# Patient Record
Sex: Female | Born: 1980 | Race: White | Hispanic: No | Marital: Single | Smoking: Never smoker
Health system: Southern US, Community
[De-identification: ages and names within clinical notes are randomized; demographics above are authoritative.]

## PROBLEM LIST (undated history)

## (undated) DIAGNOSIS — Z9889 Other specified postprocedural states: Secondary | ICD-10-CM

## (undated) DIAGNOSIS — F410 Panic disorder [episodic paroxysmal anxiety] without agoraphobia: Secondary | ICD-10-CM

## (undated) DIAGNOSIS — I359 Nonrheumatic aortic valve disorder, unspecified: Secondary | ICD-10-CM

## (undated) DIAGNOSIS — R112 Nausea with vomiting, unspecified: Secondary | ICD-10-CM

## (undated) DIAGNOSIS — Z8639 Personal history of other endocrine, nutritional and metabolic disease: Secondary | ICD-10-CM

## (undated) HISTORY — PX: OTHER SURGICAL HISTORY: SHX169

## (undated) HISTORY — PX: WISDOM TOOTH EXTRACTION: SHX21

---
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
## 2016-12-14 ENCOUNTER — Emergency Department
Admission: EM | Admit: 2016-12-14 | Discharge: 2016-12-15 | Disposition: A | Discharge status: Home / Self Care | Payer: BLUE CROSS/BLUE SHIELD | Attending: Emergency Medicine | Admitting: Emergency Medicine

## 2016-12-14 ENCOUNTER — Encounter: Payer: Self-pay | Admitting: Emergency Medicine

## 2016-12-14 ENCOUNTER — Emergency Department: Payer: BLUE CROSS/BLUE SHIELD

## 2016-12-14 DIAGNOSIS — Z5181 Encounter for therapeutic drug level monitoring: Secondary | ICD-10-CM | POA: Insufficient documentation

## 2016-12-14 DIAGNOSIS — R42 Dizziness and giddiness: Secondary | ICD-10-CM | POA: Diagnosis present

## 2016-12-14 DIAGNOSIS — R200 Anesthesia of skin: Secondary | ICD-10-CM

## 2016-12-14 DIAGNOSIS — M5010 Cervical disc disorder with radiculopathy, unspecified cervical region: Secondary | ICD-10-CM | POA: Insufficient documentation

## 2016-12-14 DIAGNOSIS — R29898 Other symptoms and signs involving the musculoskeletal system: Secondary | ICD-10-CM

## 2016-12-14 DIAGNOSIS — G959 Disease of spinal cord, unspecified: Secondary | ICD-10-CM

## 2016-12-14 DIAGNOSIS — M5020 Other cervical disc displacement, unspecified cervical region: Secondary | ICD-10-CM

## 2016-12-14 LAB — DIFFERENTIAL
Basophils Absolute: 0 10*3/uL (ref 0–0.1)
Basophils Relative: 1 %
EOS PCT: 7 %
Eosinophils Absolute: 0.7 10*3/uL (ref 0–0.7)
LYMPHS ABS: 3 10*3/uL (ref 1.0–3.6)
LYMPHS PCT: 31 %
MONO ABS: 0.7 10*3/uL (ref 0.2–0.9)
MONOS PCT: 7 %
NEUTROS ABS: 5.3 10*3/uL (ref 1.4–6.5)
Neutrophils Relative %: 54 %

## 2016-12-14 LAB — COMPREHENSIVE METABOLIC PANEL
ALK PHOS: 47 U/L (ref 38–126)
ALT: 22 U/L (ref 14–54)
AST: 30 U/L (ref 15–41)
Albumin: 4.1 g/dL (ref 3.5–5.0)
Anion gap: 8 (ref 5–15)
BILIRUBIN TOTAL: 0.8 mg/dL (ref 0.3–1.2)
BUN: 13 mg/dL (ref 6–20)
CALCIUM: 9.3 mg/dL (ref 8.9–10.3)
CO2: 27 mmol/L (ref 22–32)
CREATININE: 0.97 mg/dL (ref 0.44–1.00)
Chloride: 103 mmol/L (ref 101–111)
GFR calc non Af Amer: >60 mL/min (ref >60)
Glucose, Bld: 126 mg/dL — ABNORMAL HIGH (ref 65–99)
Potassium: 3.6 mmol/L (ref 3.5–5.1)
SODIUM: 138 mmol/L (ref 135–145)
TOTAL PROTEIN: 7.5 g/dL (ref 6.5–8.1)

## 2016-12-14 LAB — CBC
HEMATOCRIT: 40.9 % (ref 35.0–47.0)
HEMOGLOBIN: 14.5 g/dL (ref 12.0–16.0)
MCH: 30.8 pg (ref 26.0–34.0)
MCHC: 35.4 g/dL (ref 32.0–36.0)
MCV: 86.9 fL (ref 80.0–100.0)
PLATELETS: 309 10*3/uL (ref 150–440)
RBC: 4.71 MIL/uL (ref 3.80–5.20)
RDW: 13.2 % (ref 11.5–14.5)
WBC: 9.7 10*3/uL (ref 3.6–11.0)

## 2016-12-14 LAB — PROTIME-INR
INR: 1.04
Prothrombin Time: 13.6 seconds (ref 11.4–15.2)

## 2016-12-14 LAB — TROPONIN I: Troponin I: <0.03 ng/mL (ref <0.03)

## 2016-12-14 LAB — APTT: aPTT: 27 seconds (ref 24–36)

## 2016-12-14 NOTE — ED Triage Notes (Addendum)
Pt to [facility], reports dizziness starting at 1300 and left sided numbness starting about 1.5 hours ago, numbness to left tongue, mouth, face, arm.  Pt NAD at this time.  Smile symmetrical, pt reports some difficulty with speech, equal hand grip

## 2016-12-14 NOTE — ED Notes (Signed)
Per Dr. Roxan Hockeyobinson, do not call code stroke at this time
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
## 2016-12-14 NOTE — ED Notes (Signed)
Call placed to MRI tech. Waiting on response at this time.

## 2016-12-15 ENCOUNTER — Emergency Department: Payer: BLUE CROSS/BLUE SHIELD

## 2016-12-15 LAB — POCT PREGNANCY, URINE: PREG TEST UR: NEGATIVE

## 2016-12-15 MED ORDER — PREDNISONE 20 MG PO TABS
60.0000 mg | ORAL_TABLET | Freq: Once | ORAL | Status: AC
  Administered 2016-12-15: 60 mg via ORAL
  Filled 2016-12-15: qty 3

## 2016-12-15 MED ORDER — METHYLPREDNISOLONE 4 MG PO TBPK: ORAL_TABLET | ORAL | 0 refills | Status: DC

## 2016-12-15 MED ORDER — GADOBENATE DIMEGLUMINE 529 MG/ML IV SOLN
15.0000 mL | Freq: Once | INTRAVENOUS | Status: AC | PRN
  Administered 2016-12-15: 15 mL via INTRAVENOUS

## 2016-12-15 NOTE — ED Notes (Signed)
MRI tech Judeth CornfieldStephanie talking to pt via phone at this time.

## 2016-12-15 NOTE — ED Notes (Signed)
Negative urine pregnancy test.

## 2016-12-15 NOTE — ED Provider Notes (Signed)
-----------------------------------------   3:08 AM on 12/15/2016 -----------------------------------------   Blood pressure 122/69, pulse 63, temperature 97.6 F (36.4 C), resp. rate 16, height 5\' 7"  (1.702 m), weight 160 lb (72.6 kg), last menstrual period 11/14/2016, SpO2 98 %.  Assuming care from Dr. Derrill KayGoodman.  In short, Madeline Beasley is a 35 y.o. female with a chief complaint of Numbness and Dizziness .  Refer to the original H&P for additional details.  The current plan of care is to follow up the results of the MRI.  MRI cervical spine 1. 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  2. No evidence of demyelinating disease.  3. 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    MRI head Normal MRI of brain. No acute abnormality or evidence of demyelinating disease.  I contacted the neurosurgeon at [facility]. He did look at the MRI that the patient received and he agreed that the patient had a significant disc bulge. The patient reports though that her symptoms are resolved and she no longer has any weakness. The neurosurgeon recommended a CT scan to ensure that this was not a calcified bulging. He also recommended giving the patient a Medrol Dosepak and having her follow-up in the office. He did say that should the patient wanted to be transferred [facility] for more reassurance that he was okay with that as well. I discussed this with the patient and her parents and they did not feel strongly to be transferred to [facility]. I will give the patient a dose of prednisone here and I will write her a Medrol Dosepak. The patient has no other symptoms and feels improved at this time. Although she is from [place] she reports that she is willing to come back to [place] to [place].  I discussed this care with Dr. Emogene [facility]Abd-el[facility]bar[facility]  CT cervical spine 1. Disc osteophyte complex at C5-6  with moderate to severe spinal  canal stenosis and compression of the ventral aspect of spinal cord.  The soft disc component is most prominent centrally. The greatest  bony component is within the left subarticular space.  2. **An incidental finding of potential clinical significance has  been found. 1.6 cm hypodense right thyroid nodule. Further  characterization with dedicated thyroid ultrasound is recommended.**   The patient will be discharged to home.   Rebecka ApleyAllison P Webster, MD 12/15/16 [phone]

## 2016-12-15 NOTE — ED Notes (Signed)
Pt is in good condition; discharge instructions reviewed, follow up care and home care reviewed, prescription medication reviewed; pt verbalized understanding; pt is ambulatory and went home with parents

## 2016-12-15 NOTE — ED Notes (Signed)
Pt to MRI with EDT Juanetta.

## 2016-12-15 NOTE — Discharge Instructions (Signed)
Please follow up with the neurosurgeon and he reports he will call you for an appointment. Return with any worsening symptoms.

## 2017-01-06 ENCOUNTER — Encounter
Admission: RE | Admit: 2017-01-06 | Discharge: 2017-01-06 | Disposition: A | Discharge status: Home / Self Care | Payer: BLUE CROSS/BLUE SHIELD | Source: Ambulatory Visit | Attending: Neurological Surgery | Admitting: Neurological Surgery

## 2017-01-06 ENCOUNTER — Ambulatory Visit
Admission: RE | Admit: 2017-01-06 | Discharge: 2017-01-06 | Disposition: A | Discharge status: Home / Self Care | Payer: BLUE CROSS/BLUE SHIELD | Source: Ambulatory Visit | Attending: Neurological Surgery | Admitting: Neurological Surgery

## 2017-01-06 DIAGNOSIS — Z01810 Encounter for preprocedural cardiovascular examination: Secondary | ICD-10-CM | POA: Diagnosis not present

## 2017-01-06 DIAGNOSIS — I359 Nonrheumatic aortic valve disorder, unspecified: Secondary | ICD-10-CM | POA: Diagnosis not present

## 2017-01-06 DIAGNOSIS — Z01812 Encounter for preprocedural laboratory examination: Secondary | ICD-10-CM | POA: Insufficient documentation

## 2017-01-06 DIAGNOSIS — Z01818 Encounter for other preprocedural examination: Secondary | ICD-10-CM | POA: Insufficient documentation

## 2017-01-06 HISTORY — DX: Nausea with vomiting, unspecified: Z98.890

## 2017-01-06 HISTORY — DX: Personal history of other endocrine, nutritional and metabolic disease: Z86.39

## 2017-01-06 HISTORY — DX: Nausea with vomiting, unspecified: R11.2

## 2017-01-06 HISTORY — DX: Nonrheumatic aortic valve disorder, unspecified: I35.9

## 2017-01-06 HISTORY — DX: Panic disorder (episodic paroxysmal anxiety): F41.0

## 2017-01-06 LAB — TYPE AND SCREEN
ABO/RH(D): O NEG
Antibody Screen: NEGATIVE

## 2017-01-06 LAB — BASIC METABOLIC PANEL
Anion gap: 6 (ref 5–15)
BUN: 10 mg/dL (ref 6–20)
CALCIUM: 9 mg/dL (ref 8.9–10.3)
CO2: 26 mmol/L (ref 22–32)
CREATININE: 0.85 mg/dL (ref 0.44–1.00)
Chloride: 103 mmol/L (ref 101–111)
GFR calc Af Amer: >60 mL/min (ref >60)
GFR calc non Af Amer: >60 mL/min (ref >60)
GLUCOSE: 87 mg/dL (ref 65–99)
Potassium: 3.8 mmol/L (ref 3.5–5.1)
Sodium: 135 mmol/L (ref 135–145)

## 2017-01-06 LAB — CBC
HEMATOCRIT: 39.6 % (ref 35.0–47.0)
Hemoglobin: 14 g/dL (ref 12.0–16.0)
MCH: 30.8 pg (ref 26.0–34.0)
MCHC: 35.3 g/dL (ref 32.0–36.0)
MCV: 87.3 fL (ref 80.0–100.0)
Platelets: 255 10*3/uL (ref 150–440)
RBC: 4.54 MIL/uL (ref 3.80–5.20)
RDW: 13.3 % (ref 11.5–14.5)
WBC: 7.1 10*3/uL (ref 3.6–11.0)

## 2017-01-06 LAB — URINALYSIS, COMPLETE (UACMP) WITH MICROSCOPIC
BILIRUBIN URINE: NEGATIVE
Glucose, UA: NEGATIVE mg/dL
HGB URINE DIPSTICK: NEGATIVE
Ketones, ur: NEGATIVE mg/dL
Leukocytes, UA: NEGATIVE
NITRITE: NEGATIVE
PH: 7 (ref 5.0–8.0)
Protein, ur: NEGATIVE mg/dL
RBC / HPF: NONE SEEN RBC/hpf (ref 0–5)
SPECIFIC GRAVITY, URINE: 1.015 (ref 1.005–1.030)

## 2017-01-06 LAB — PROTIME-INR
INR: 1.12
Prothrombin Time: 14.5 seconds (ref 11.4–15.2)

## 2017-01-06 LAB — APTT: aPTT: 28 seconds (ref 24–36)

## 2017-01-06 LAB — SURGICAL PCR SCREEN
MRSA, PCR: NEGATIVE
STAPHYLOCOCCUS AUREUS: NEGATIVE

## 2017-01-06 NOTE — Patient Instructions (Signed)
  Your procedure is scheduled on: January 08, 2017 (Wednesday) Report to Same Day Surgery 2nd floor medical mall [facility](Medical Mall Entrance-take elevator on left to 2nd floor.  Check in with surgery information desk.) To find out your arrival time please call [phone] between 1PM - 3PM on January 07, 2017 (Tuesday)  Remember: Instructions that are not followed completely may result in serious medical risk, up to and including death, or upon the discretion of your surgeon and anesthesiologist your surgery may need to be rescheduled.    _x___ 1. Do not eat food or drink liquids after midnight. No gum chewing or hard candies.     __x__ 2. No Alcohol for 24 hours before or after surgery.   __x__3. No Smoking for 24 prior to surgery.   ____  4. Bring all medications with you on the day of surgery if instructed.    __x__ 5. Notify your doctor if there is any change in your medical condition     (cold, fever, infections).     Do not wear jewelry, make-up, hairpins, clips or nail polish.  Do not wear lotions, powders, or perfumes. You may wear deodorant.  Do not shave 48 hours prior to surgery. Men may shave face and neck.  Do not bring valuables to the hospital.    [facility] is not responsible for any belongings or valuables.               Contacts, dentures or bridgework may not be worn into surgery.  Leave your suitcase in the car. After surgery it may be brought to your room.  For patients admitted to the hospital, discharge time is determined by your treatment team.   Patients discharged the day of surgery will not be allowed to drive home.  You will need someone to drive you home and stay with you the night of your procedure.    Please read over the following fact sheets that you were given:   [facility] Preparing for Surgery and or MRSA Information   ___ Take these medicines the morning of surgery with A SIP OF WATER:    1.   2.  3.  4.  5.  6.  ____Fleets enema or  Magnesium Citrate as directed.   _x___ Use CHG Soap or sage wipes as directed on instruction sheet   ____ Use inhalers on the day of surgery and bring to hospital day of surgery  ____ Stop metformin 2 days prior to surgery    ____ Take 1/2 of usual insulin dose the night before surgery and none on the morning of           surgery.   __x__ Stop Aspirin, Coumadin, Pllavix ,Eliquis, Effient, or Pradaxa (NO ASPIRIN)  x__ Stop Anti-inflammatories such as Advil, Aleve, Ibuprofen, Motrin, Naproxen,          Naprosyn, Goodies powders or aspirin products. Ok to take Tylenol.   ____ Stop supplements until after surgery.    ____ Bring C-Pap to the hospital.

## 2017-01-08 MED ORDER — LACTATED RINGERS IV SOLN
INTRAVENOUS | Status: DC
  Administered 2017-01-29: 10:00:00 via INTRAVENOUS

## 2017-01-08 MED ORDER — FAMOTIDINE 20 MG PO TABS
20.0000 mg | ORAL_TABLET | Freq: Once | ORAL | Status: AC
  Administered 2017-01-29: 20 mg via ORAL

## 2017-01-08 MED ORDER — GELATIN ABSORBABLE 12-7 MM EX MISC
CUTANEOUS | Status: AC
  Filled 2017-01-08: qty 1

## 2017-01-08 MED ORDER — CEFAZOLIN IN D5W 1 GM/50ML IV SOLN
1.0000 g | Freq: Once | INTRAVENOUS | Status: AC
  Administered 2017-01-29: 2 g via INTRAVENOUS

## 2017-01-08 MED ORDER — FAMOTIDINE 20 MG PO TABS
ORAL_TABLET | ORAL | Status: AC
  Filled 2017-01-08: qty 1

## 2017-01-08 MED ORDER — BACITRACIN 50000 UNITS IM SOLR
INTRAMUSCULAR | Status: AC
  Filled 2017-01-08: qty 1

## 2017-01-08 MED ORDER — THROMBIN 5000 UNITS EX SOLR
CUTANEOUS | Status: AC
  Filled 2017-01-08: qty 5000

## 2017-01-08 MED ORDER — SODIUM CHLORIDE 0.9 % IJ SOLN
INTRAMUSCULAR | Status: AC
  Filled 2017-01-08: qty 10

## 2017-01-08 MED ORDER — SCOPOLAMINE 1 MG/3DAYS TD PT72
1.0000 | MEDICATED_PATCH | TRANSDERMAL | Status: DC
  Administered 2017-01-29: 1.5 mg via TRANSDERMAL

## 2017-01-08 MED ORDER — EPINEPHRINE PF 1 MG/ML IJ SOLN
INTRAMUSCULAR | Status: AC
  Filled 2017-01-08: qty 1

## 2017-01-08 MED ORDER — BUPIVACAINE HCL (PF) 0.5 % IJ SOLN
INTRAMUSCULAR | Status: AC
  Filled 2017-01-08: qty 30

## 2017-01-08 MED ORDER — CEFAZOLIN IN D5W 1 GM/50ML IV SOLN
INTRAVENOUS | Status: AC
  Filled 2017-01-08: qty 50

## 2017-01-08 NOTE — H&P (Signed)
Referring Physician:  Self No address on file  Primary Physician:  Madeline MolderStephanie Peterson, PA  Chief Complaint: Left upper extremity numbness.  History of Present Illness: Madeline FreshwaterJennifer Beasley is a 36 y.o. female who presents with the chief complaint of left upper extremity numbness.  Patient has had intermittent left upper extremity numbness for many months.  On December 24 she had an acute exacerbation of this numbness where she felt her left arm was completely numb.  She also had some neck pain and some dizziness associated with this.  No trauma.  She went to the ER where an MRI showed that she had a large C5-C6 disc herniation and a moderate size C6-C7 disc herniation as well.  She was given a Medrol dose pack which helped a little bit with her symptoms.  She does not have any lower extremity symptoms.  She does not have any right-sided symptoms as well.  She has not tried physical therapy.  No bowel or bladder problems.   The symptoms are causing a significant impact on the patient's life.   Review of Systems:  A 10 point review of systems is negative, except for the pertinent positives and negatives detailed in the HPI.  Past Medical History:     Past Medical History:  Diagnosis Date  . Irregular heartbeat     Past Surgical History:      Past Surgical History:  Procedure Laterality Date  . gum surgery  2017  . jaw surgery  1999    Problem List: There is no problem list on file for this patient.   Allergies:    Allergies as of 12/30/2016  . (No Known Allergies)    Medications: Encounter Medications        Outpatient Encounter Prescriptions as of 12/30/2016  Medication Sig Dispense Refill  . norethindrone-ethinyl estradiol (MICROGESTIN,LOESTRIN,JUNEL 1/20) 1-20 mg-mcg tablet      No facility-administered encounter medications on file as of 12/30/2016.       Social History:        Social History   Substance Use Topics   . Smoking status: Never  Smoker   . Smokeless tobacco: Never Used   . Alcohol use Yes     Comment: socially     Family Medical History:      Family History  Problem Relation Age of Onset  . Thyroid disease Mother   . No Known Problems Father     Physical Examination:    Vitals:   12/30/16 1356  BP: 165/86  Pulse: 73  Weight: 75.7 kg (166 lb 12.8 oz)  Height: 170.2 cm (5\' 7" )  PainSc: 0-No pain  PainLoc: Neck    General:             Patient is well developed, well nourished, calm, collected, and in no apparent distress.  Psychiatric:        Patient is non-anxious.  Head:                 Pupils equal, round, and reactive to light.  ENT:                  Oral mucosa appears well hydrated.  Neck:                 Supple.  Full range of motion.  Respiratory:       Patient is breathing without any difficulty.  Extremities:        No edema.  Vascular:  Palpable pulses.  Skin:                  On exposed skin, there are no abnormal skin lesions.  NEUROLOGICAL:  General: In no acute distress.   Awake, alert, oriented to person, place, and time.  Pupils equal round and reactive to light.  Facial tone is symmetric.  Tongue protrusion is midline.  There is no pronator drift.    Strength: Side Biceps Triceps Deltoid Interossei Grip Wrist Ext. Wrist Flex.  R 5 5 5 5 5 5 5   L 5 5 5 5 5 5 5    Side Iliopsoas Quads Hamstring PF DF EHL  R 5 5 5 5 5 5   L 5 5 5 5 5 5    Reflexes are 2+ and symmetric at the biceps, triceps, brachioradialis, patella and achilles.   Bilateral upper and lower extremity sensation is intact to light touch and pin prick.  Clonus is not present.  Toes are down-going.  Gait is normal.  No difficulty with tandem gait.  Hoffman's is absent.  Imaging: MRI cervical spine 12/15/2016 shows large disc herniation at C5-6, and moderate at C6-7.  CT shows evidence of large osteophytes at C5-6 and some ossification of the posterior longitudinal  ligament.   I have personally reviewed the images and agree with the above interpretation.  Assessment and Plan: [name] is a pleasant 36 y.o. female with left upper extremity radiculopathy.  I have discussed the condition with the patient, including showing the radiographs and discussing treatment options in layman's terms.  The patient may benefit from conservative management.  Thus, I have recommended the following:   1) Physical therapy.  If this does not help with her symptoms, she would be a good candidate for C5-7 ACDF.  I do not think that arthroplasty would be good in her case given the degree of ossification.  I will see the patient back in a few weeks to gauge progress.  Thank you for involving me in the care of this patient. I will keep you apprised of the patient's progress.   This note was partially dictated using voice recognition software, so please excuse any errors that were not corrected.   Noe Gens, MD     Electronically signed by Tomma Rakers Abd-El-Barr, MD at 12/30/2016 11:47 PM

## 2017-01-29 ENCOUNTER — Observation Stay
Admission: RE | Admit: 2017-01-29 | Discharge: 2017-01-30 | Disposition: A | Discharge status: Home / Self Care | Payer: BLUE CROSS/BLUE SHIELD | Source: Ambulatory Visit | Attending: Neurological Surgery | Admitting: Neurological Surgery

## 2017-01-29 ENCOUNTER — Encounter: Payer: Self-pay | Admitting: *Deleted

## 2017-01-29 ENCOUNTER — Ambulatory Visit: Payer: BLUE CROSS/BLUE SHIELD | Admitting: Anesthesiology

## 2017-01-29 ENCOUNTER — Encounter
Admission: RE | Disposition: A | Discharge status: Home / Self Care | Payer: Self-pay | Source: Ambulatory Visit | Attending: Neurological Surgery

## 2017-01-29 ENCOUNTER — Observation Stay: Payer: BLUE CROSS/BLUE SHIELD

## 2017-01-29 ENCOUNTER — Ambulatory Visit: Payer: BLUE CROSS/BLUE SHIELD

## 2017-01-29 DIAGNOSIS — M50222 Other cervical disc displacement at C5-C6 level: Principal | ICD-10-CM | POA: Insufficient documentation

## 2017-01-29 DIAGNOSIS — M2578 Osteophyte, vertebrae: Secondary | ICD-10-CM | POA: Diagnosis not present

## 2017-01-29 DIAGNOSIS — F419 Anxiety disorder, unspecified: Secondary | ICD-10-CM | POA: Diagnosis not present

## 2017-01-29 DIAGNOSIS — M4802 Spinal stenosis, cervical region: Secondary | ICD-10-CM | POA: Diagnosis present

## 2017-01-29 DIAGNOSIS — Z09 Encounter for follow-up examination after completed treatment for conditions other than malignant neoplasm: Secondary | ICD-10-CM

## 2017-01-29 DIAGNOSIS — Z419 Encounter for procedure for purposes other than remedying health state, unspecified: Secondary | ICD-10-CM

## 2017-01-29 HISTORY — PX: ANTERIOR CERVICAL DECOMP/DISCECTOMY FUSION: SHX1161

## 2017-01-29 LAB — ABO/RH: ABO/RH(D): O NEG

## 2017-01-29 LAB — CBC
HEMATOCRIT: 39.6 % (ref 35.0–47.0)
HEMOGLOBIN: 13.6 g/dL (ref 12.0–16.0)
MCH: 30.7 pg (ref 26.0–34.0)
MCHC: 34.4 g/dL (ref 32.0–36.0)
MCV: 89.1 fL (ref 80.0–100.0)
Platelets: 240 10*3/uL (ref 150–440)
RBC: 4.44 MIL/uL (ref 3.80–5.20)
RDW: 13.3 % (ref 11.5–14.5)
WBC: 9.3 10*3/uL (ref 3.6–11.0)

## 2017-01-29 LAB — CREATININE, SERUM
Creatinine, Ser: 1 mg/dL (ref 0.44–1.00)
GFR calc Af Amer: >60 mL/min (ref >60)
GFR calc non Af Amer: >60 mL/min (ref >60)

## 2017-01-29 LAB — POCT PREGNANCY, URINE: Preg Test, Ur: NEGATIVE

## 2017-01-29 SURGERY — ANTERIOR CERVICAL DECOMPRESSION/DISCECTOMY FUSION 2 LEVELS
Anesthesia: General | Wound class: Clean

## 2017-01-29 MED ORDER — SEVOFLURANE IN SOLN
RESPIRATORY_TRACT | Status: AC
  Filled 2017-01-29: qty 250

## 2017-01-29 MED ORDER — GELATIN ABSORBABLE 12-7 MM EX MISC
CUTANEOUS | Status: AC
  Filled 2017-01-29: qty 1

## 2017-01-29 MED ORDER — SUCCINYLCHOLINE CHLORIDE 20 MG/ML IJ SOLN
INTRAMUSCULAR | Status: DC | PRN
  Administered 2017-01-29: 100 mg via INTRAVENOUS

## 2017-01-29 MED ORDER — DEXAMETHASONE SODIUM PHOSPHATE 10 MG/ML IJ SOLN
INTRAMUSCULAR | Status: DC | PRN
  Administered 2017-01-29: 10 mg via INTRAVENOUS

## 2017-01-29 MED ORDER — PROPOFOL 10 MG/ML IV BOLUS
INTRAVENOUS | Status: AC
  Filled 2017-01-29: qty 20

## 2017-01-29 MED ORDER — MIDAZOLAM HCL 2 MG/2ML IJ SOLN
INTRAMUSCULAR | Status: DC | PRN
Start: 2017-01-29 — End: 2017-01-29
  Administered 2017-01-29: 2 mg via INTRAVENOUS

## 2017-01-29 MED ORDER — HYDROMORPHONE HCL 1 MG/ML IJ SOLN
0.5000 mg | INTRAMUSCULAR | Status: DC | PRN
Start: 2017-01-29 — End: 2017-01-30

## 2017-01-29 MED ORDER — ONDANSETRON HCL 4 MG/2ML IJ SOLN
INTRAMUSCULAR | Status: AC
  Filled 2017-01-29: qty 2

## 2017-01-29 MED ORDER — GLYCOPYRROLATE 0.2 MG/ML IJ SOLN
INTRAMUSCULAR | Status: DC | PRN
  Administered 2017-01-29: 0.2 mg via INTRAVENOUS

## 2017-01-29 MED ORDER — SUCCINYLCHOLINE CHLORIDE 200 MG/10ML IV SOSY
PREFILLED_SYRINGE | INTRAVENOUS | Status: AC
  Filled 2017-01-29: qty 10

## 2017-01-29 MED ORDER — PHENYLEPHRINE HCL 10 MG/ML IJ SOLN
INTRAMUSCULAR | Status: DC | PRN
  Administered 2017-01-29: 50 ug via INTRAVENOUS

## 2017-01-29 MED ORDER — LACTATED RINGERS IV SOLN: INTRAVENOUS | Status: DC | PRN

## 2017-01-29 MED ORDER — ONDANSETRON HCL 4 MG/2ML IJ SOLN
INTRAMUSCULAR | Status: DC | PRN
  Administered 2017-01-29: 4 mg via INTRAVENOUS

## 2017-01-29 MED ORDER — MENTHOL 3 MG MT LOZG
1.0000 | LOZENGE | OROMUCOSAL | Status: DC | PRN
  Filled 2017-01-29: qty 9

## 2017-01-29 MED ORDER — THROMBIN 5000 UNITS EX SOLR
CUTANEOUS | Status: AC
  Filled 2017-01-29: qty 5000

## 2017-01-29 MED ORDER — CEFAZOLIN SODIUM-DEXTROSE 2-3 GM-% IV SOLR
2.0000 g | Freq: Three times a day (TID) | INTRAVENOUS | Status: AC
Start: 2017-01-29 — End: 2017-01-30
  Administered 2017-01-29 – 2017-01-30 (×2): 2 g via INTRAVENOUS
  Filled 2017-01-29 (×2): qty 50

## 2017-01-29 MED ORDER — BUPIVACAINE-EPINEPHRINE 0.5% -1:200000 IJ SOLN
INTRAMUSCULAR | Status: DC | PRN
  Administered 2017-01-29: 10 mL

## 2017-01-29 MED ORDER — OXYCODONE-ACETAMINOPHEN 5-325 MG PO TABS: 1.0000 | ORAL_TABLET | ORAL | Status: DC | PRN

## 2017-01-29 MED ORDER — THROMBIN 5000 UNITS EX SOLR
CUTANEOUS | Status: DC | PRN
  Administered 2017-01-29: 5000 [IU] via TOPICAL

## 2017-01-29 MED ORDER — SENNOSIDES-DOCUSATE SODIUM 8.6-50 MG PO TABS: 1.0000 | ORAL_TABLET | Freq: Every evening | ORAL | Status: DC | PRN

## 2017-01-29 MED ORDER — ONDANSETRON HCL 4 MG/2ML IJ SOLN: 4.0000 mg | Freq: Once | INTRAMUSCULAR | Status: DC | PRN

## 2017-01-29 MED ORDER — SODIUM CHLORIDE FLUSH 0.9 % IV SOLN
INTRAVENOUS | Status: AC
Start: 2017-01-29 — End: 2017-01-29
  Filled 2017-01-29: qty 10

## 2017-01-29 MED ORDER — ROCURONIUM BROMIDE 100 MG/10ML IV SOLN
INTRAVENOUS | Status: DC | PRN
  Administered 2017-01-29: 5 mg via INTRAVENOUS

## 2017-01-29 MED ORDER — FENTANYL CITRATE (PF) 100 MCG/2ML IJ SOLN
INTRAMUSCULAR | Status: DC | PRN
  Administered 2017-01-29: 150 ug via INTRAVENOUS
  Administered 2017-01-29: 100 ug via INTRAVENOUS

## 2017-01-29 MED ORDER — LIDOCAINE 2% (20 MG/ML) 5 ML SYRINGE
INTRAMUSCULAR | Status: DC | PRN
  Administered 2017-01-29: 100 mg via INTRAVENOUS

## 2017-01-29 MED ORDER — ACETAMINOPHEN 650 MG RE SUPP: 650.0000 mg | RECTAL | Status: DC | PRN

## 2017-01-29 MED ORDER — FENTANYL CITRATE (PF) 250 MCG/5ML IJ SOLN
INTRAMUSCULAR | Status: AC
  Filled 2017-01-29: qty 5

## 2017-01-29 MED ORDER — SODIUM CHLORIDE 0.9 % IJ SOLN
INTRAMUSCULAR | Status: AC
  Filled 2017-01-29: qty 10

## 2017-01-29 MED ORDER — REMIFENTANIL HCL 1 MG IV SOLR
INTRAVENOUS | Status: AC
  Filled 2017-01-29: qty 1000

## 2017-01-29 MED ORDER — PHENOL 1.4 % MT LIQD
1.0000 | OROMUCOSAL | Status: DC | PRN
  Administered 2017-01-29 – 2017-01-30 (×2): 1 via OROMUCOSAL
  Filled 2017-01-29 (×2): qty 177

## 2017-01-29 MED ORDER — FENTANYL CITRATE (PF) 100 MCG/2ML IJ SOLN
INTRAMUSCULAR | Status: AC
  Administered 2017-01-29: 25 ug via INTRAVENOUS
  Filled 2017-01-29: qty 2

## 2017-01-29 MED ORDER — BUPIVACAINE-EPINEPHRINE (PF) 0.5% -1:200000 IJ SOLN
INTRAMUSCULAR | Status: AC
Start: 2017-01-29 — End: 2017-01-29
  Filled 2017-01-29: qty 30

## 2017-01-29 MED ORDER — BACITRACIN 50000 UNITS IM SOLR
INTRAMUSCULAR | Status: DC | PRN
  Administered 2017-01-29: 200 mL

## 2017-01-29 MED ORDER — CEFAZOLIN SODIUM-DEXTROSE 2-4 GM/100ML-% IV SOLN
2.0000 g | Freq: Three times a day (TID) | INTRAVENOUS | Status: DC
  Filled 2017-01-29 (×2): qty 100

## 2017-01-29 MED ORDER — PHENYLEPHRINE HCL 10 MG/ML IJ SOLN
INTRAMUSCULAR | Status: DC | PRN
  Administered 2017-01-29: 10 ug/min via INTRAVENOUS

## 2017-01-29 MED ORDER — DEXAMETHASONE SODIUM PHOSPHATE 10 MG/ML IJ SOLN
INTRAMUSCULAR | Status: AC
  Filled 2017-01-29: qty 1

## 2017-01-29 MED ORDER — FENTANYL CITRATE (PF) 100 MCG/2ML IJ SOLN
25.0000 ug | INTRAMUSCULAR | Status: DC | PRN
  Administered 2017-01-29 (×4): 25 ug via INTRAVENOUS

## 2017-01-29 MED ORDER — POTASSIUM CHLORIDE IN NACL 20-0.9 MEQ/L-% IV SOLN
INTRAVENOUS | Status: DC
  Administered 2017-01-29: 15:00:00 via INTRAVENOUS
  Filled 2017-01-29 (×2): qty 1000

## 2017-01-29 MED ORDER — SODIUM CHLORIDE 0.9% FLUSH: 3.0000 mL | INTRAVENOUS | Status: DC | PRN

## 2017-01-29 MED ORDER — DOCUSATE SODIUM 100 MG PO CAPS
100.0000 mg | ORAL_CAPSULE | Freq: Two times a day (BID) | ORAL | Status: DC
  Administered 2017-01-29: 100 mg via ORAL
  Filled 2017-01-29 (×2): qty 1

## 2017-01-29 MED ORDER — REMIFENTANIL HCL 1 MG IV SOLR
INTRAVENOUS | Status: DC | PRN
  Administered 2017-01-29: .1 ug/kg/min via INTRAVENOUS

## 2017-01-29 MED ORDER — ACETAMINOPHEN 325 MG PO TABS: 650.0000 mg | ORAL_TABLET | ORAL | Status: DC | PRN

## 2017-01-29 MED ORDER — GELATIN ABSORBABLE 12-7 MM EX MISC
CUTANEOUS | Status: DC | PRN
  Administered 2017-01-29: 1

## 2017-01-29 MED ORDER — SODIUM CHLORIDE 0.9 % IV SOLN: 250.0000 mL | INTRAVENOUS | Status: DC

## 2017-01-29 MED ORDER — BACITRACIN 50000 UNITS IM SOLR
INTRAMUSCULAR | Status: AC
  Filled 2017-01-29: qty 1

## 2017-01-29 MED ORDER — PROPOFOL 500 MG/50ML IV EMUL
INTRAVENOUS | Status: DC | PRN
  Administered 2017-01-29: 75 ug/kg/min via INTRAVENOUS

## 2017-01-29 MED ORDER — SODIUM CHLORIDE 0.9 % IJ SOLN
INTRAMUSCULAR | Status: AC
  Filled 2017-01-29: qty 40

## 2017-01-29 MED ORDER — SODIUM CHLORIDE 0.9% FLUSH
3.0000 mL | Freq: Two times a day (BID) | INTRAVENOUS | Status: DC
  Administered 2017-01-29: 3 mL via INTRAVENOUS

## 2017-01-29 MED ORDER — HYDROCODONE-ACETAMINOPHEN 5-325 MG PO TABS
1.0000 | ORAL_TABLET | ORAL | Status: DC | PRN
Start: 2017-01-29 — End: 2017-01-30
  Administered 2017-01-29: 2 via ORAL
  Administered 2017-01-29: 1 via ORAL
  Administered 2017-01-30: 2 via ORAL
  Filled 2017-01-29: qty 2
  Filled 2017-01-29: qty 1
  Filled 2017-01-29: qty 2

## 2017-01-29 MED ORDER — SODIUM CHLORIDE 0.9 % IV SOLN
INTRAVENOUS | Status: DC | PRN
  Administered 2017-01-29: 10:00:00 via INTRAVENOUS

## 2017-01-29 MED ORDER — HEPARIN SODIUM (PORCINE) 5000 UNIT/ML IJ SOLN
5000.0000 [IU] | Freq: Three times a day (TID) | INTRAMUSCULAR | Status: DC
  Administered 2017-01-29 – 2017-01-30 (×2): 5000 [IU] via SUBCUTANEOUS
  Filled 2017-01-29 (×3): qty 1

## 2017-01-29 MED ORDER — MIDAZOLAM HCL 2 MG/2ML IJ SOLN
INTRAMUSCULAR | Status: AC
  Filled 2017-01-29: qty 2

## 2017-01-29 MED ORDER — ONDANSETRON HCL 4 MG/2ML IJ SOLN: 4.0000 mg | INTRAMUSCULAR | Status: DC | PRN

## 2017-01-29 MED ORDER — PROPOFOL 10 MG/ML IV BOLUS
INTRAVENOUS | Status: DC | PRN
  Administered 2017-01-29: 150 mg via INTRAVENOUS

## 2017-01-29 MED ORDER — PROPOFOL 500 MG/50ML IV EMUL
INTRAVENOUS | Status: AC
  Filled 2017-01-29: qty 50

## 2017-01-29 SURGICAL SUPPLY — 58 items
ANCHOR GUIDE 12 COALITION (Anchor) ×8 IMPLANT
BAND RUBBER 3X1/6 TAN STRL (MISCELLANEOUS) ×4 IMPLANT
BANDAGE EYE OVAL (MISCELLANEOUS) ×2 IMPLANT
BLADE BOVIE TIP EXT 4 (BLADE) ×2 IMPLANT
BLADE SURG 15 STRL LF DISP TIS (BLADE) ×1 IMPLANT
BLADE SURG 15 STRL SS (BLADE) ×1
BUR NEURO DRILL SOFT 3.0X3.8M (BURR) ×2 IMPLANT
CANISTER SUCT 1200ML W/VALVE (MISCELLANEOUS) ×2 IMPLANT
CHLORAPREP W/TINT 26ML (MISCELLANEOUS) ×2 IMPLANT
COUNTER NEEDLE 20/40 LG (NEEDLE) ×2 IMPLANT
COVER LIGHT HANDLE STERIS (MISCELLANEOUS) ×4 IMPLANT
CRADLE LAMINECT ARM (MISCELLANEOUS) ×2 IMPLANT
CUP MEDICINE 2OZ PLAST GRAD ST (MISCELLANEOUS) ×4 IMPLANT
DRAPE C-ARM 42X72 X-RAY (DRAPES) ×4 IMPLANT
DRAPE INCISE IOBAN 66X45 STRL (DRAPES) IMPLANT
DRAPE MICROSCOPE LEICA (MISCELLANEOUS) ×2 IMPLANT
DRAPE POUCH INSTRU U-SHP 10X18 (DRAPES) ×2 IMPLANT
DRAPE SHEET LG 3/4 BI-LAMINATE (DRAPES) ×2 IMPLANT
DRAPE SURG 17X11 SM STRL (DRAPES) ×4 IMPLANT
DRAPE TABLE BACK 80X90 (DRAPES) ×2 IMPLANT
DRAPE THYROID T SHEET (DRAPES) ×2 IMPLANT
ELECT CAUTERY BLADE TIP 2.5 (TIP) ×2
ELECTRODE CAUTERY BLDE TIP 2.5 (TIP) ×1 IMPLANT
FEE INTRAOP MONITOR IMPULS NCS (MISCELLANEOUS) ×1 IMPLANT
GAUZE SPONGE 4X4 12PLY STRL (GAUZE/BANDAGES/DRESSINGS) IMPLANT
GLOVE BIO SURGEON STRL SZ8 (GLOVE) ×4 IMPLANT
GLOVE BIOGEL PI IND STRL 8 (GLOVE) ×1 IMPLANT
GLOVE BIOGEL PI INDICATOR 8 (GLOVE) ×1
GOWN STRL REUS W/ TWL LRG LVL3 (GOWN DISPOSABLE) ×1 IMPLANT
GOWN STRL REUS W/ TWL XL LVL3 (GOWN DISPOSABLE) ×1 IMPLANT
GOWN STRL REUS W/TWL LRG LVL3 (GOWN DISPOSABLE) ×1
GOWN STRL REUS W/TWL XL LVL3 (GOWN DISPOSABLE) ×1
GRADUATE 1200CC STRL 31836 (MISCELLANEOUS) ×2 IMPLANT
INTRAOP MONITOR FEE IMPULS NCS (MISCELLANEOUS) ×1
INTRAOP MONITOR FEE IMPULSE (MISCELLANEOUS) ×1
IV CATH ANGIO 12GX3 LT BLUE (NEEDLE) IMPLANT
KIT RM TURNOVER STRD PROC AR (KITS) ×2 IMPLANT
MARKER SKIN DUAL TIP RULER LAB (MISCELLANEOUS) ×4 IMPLANT
NEEDLE HYPO 22GX1.5 SAFETY (NEEDLE) ×2 IMPLANT
NS IRRIG 1000ML POUR BTL (IV SOLUTION) ×2 IMPLANT
PACK LAMINECTOMY NEURO (CUSTOM PROCEDURE TRAY) ×2 IMPLANT
PIN CASPAR 14 (PIN) ×1 IMPLANT
PIN CASPAR 14MM (PIN) ×2
PUTTY DBX 5CC (Putty) ×2 IMPLANT
SLEEVE GASTRECTOMY 36FR VISIGI (MISCELLANEOUS) ×2 IMPLANT
SPACER COALITION 12X14 7D 5MM (Spine Construct) ×2 IMPLANT
SPACER COALITION 12X14 7D 8MM (Spine Construct) ×2 IMPLANT
SPOGE SURGIFLO 8M (HEMOSTASIS) ×1
SPONGE KITTNER 5P (MISCELLANEOUS) ×2 IMPLANT
SPONGE SURGIFLO 8M (HEMOSTASIS) ×1 IMPLANT
STRIP CLOSURE SKIN 1/2X4 (GAUZE/BANDAGES/DRESSINGS) ×2 IMPLANT
SUT MNCRL AB 4-0 PS2 18 (SUTURE) ×2 IMPLANT
SUT VIC AB 3-0 SH 8-18 (SUTURE) ×4 IMPLANT
SYR 30ML LL (SYRINGE) ×2 IMPLANT
TAPE ADH 3 LX (MISCELLANEOUS) ×2 IMPLANT
TOWEL OR 17X26 4PK STRL BLUE (TOWEL DISPOSABLE) ×4 IMPLANT
TRAY FOLEY W/METER SILVER 16FR (SET/KITS/TRAYS/PACK) ×2 IMPLANT
TUBING CONNECTING 10 (TUBING) ×2 IMPLANT

## 2017-01-29 NOTE — Plan of Care (Signed)
Problem: Activity: Goal: Risk for activity intolerance will decrease Outcome: Progressing Patient up to recliner. Tolerated well.

## 2017-01-29 NOTE — Anesthesia Procedure Notes (Signed)
Procedure Name: Intubation Date/Time: 01/29/2017 10:05 AM Performed by: Marsh Dolly Pre-anesthesia Checklist: Patient identified, Patient being monitored, Timeout performed, Emergency Drugs available and Suction available Patient Re-evaluated:Patient Re-evaluated prior to inductionOxygen Delivery Method: Circle system utilized Preoxygenation: Pre-oxygenation with 100% oxygen Intubation Type: IV induction Ventilation: Mask ventilation without difficulty Laryngoscope Size: Mac and 3 Grade View: Grade I Tube type: Oral Tube size: 7.0 mm Number of attempts: 1 Placement Confirmation: ETT inserted through vocal cords under direct vision,  positive ETCO2 and breath sounds checked- equal and bilateral Secured at: 21 cm Tube secured with: Tape Dental Injury: Teeth and Oropharynx as per pre-operative assessment  Comments: c spine neutral

## 2017-01-29 NOTE — Anesthesia Post-op Follow-up Note (Cosign Needed)
Anesthesia QCDR form completed.        

## 2017-01-29 NOTE — Brief Op Note (Signed)
01/29/2017  1:49 PM  PATIENT:  Ova FreshwaterJennifer Labine  36 y.o. female  PRE-OPERATIVE DIAGNOSIS:  CERVICAL STENOSIS  POST-OPERATIVE DIAGNOSIS:  CERVICAL STENOSIS  PROCEDURE:  Procedure(s): ANTERIOR CERVICAL DECOMPRESSION/DISCECTOMY FUSION 2 LEVELS (N/A)  SURGEON:  Surgeon(s) and Role:    * Karenann CaiMuhammad Abd-El-Barr, MD - Primary  PHYSICIAN ASSISTANT:   ASSISTANTS: Anabel Halonon Drinkwater MD   ANESTHESIA:   general  EBL:  Total I/O In: 1300 [I.V.:1300] Out: 25 [Blood:25]  BLOOD ADMINISTERED:none  DRAINS: none   LOCAL MEDICATIONS USED:  BUPIVICAINE   SPECIMEN:  No Specimen  DISPOSITION OF SPECIMEN:  N/A  COUNTS:  YES  TOURNIQUET:  * No tourniquets in log *  DICTATION: .Dragon Dictation  PLAN OF CARE: Admit for overnight observation  PATIENT DISPOSITION:  PACU - hemodynamically stable.   Delay start of Pharmacological VTE agent (>24hrs) due to surgical blood loss or risk of bleeding: no

## 2017-01-29 NOTE — Interval H&P Note (Signed)
History and Physical Interval Note:  01/29/2017 9:14 AM  Madeline Beasley  has presented today for surgery, with the diagnosis of CERVICAL STENOSIS  The various methods of treatment have been discussed with the patient and family. After consideration of risks, benefits and other options for treatment, the patient has consented to  Procedure(s): ANTERIOR CERVICAL DECOMPRESSION/DISCECTOMY FUSION 2 LEVELS (N/A) as a surgical intervention .  The patient's history has been reviewed, patient examined, no change in status, stable for surgery.  I have reviewed the patient's chart and labs.  Questions were answered to the patient's satisfaction.    Please note  CV and lung exam:  Cv:  RRR Lungs:  CTAB  Plan for C5-7 ACDF   Bond Grieshop Abd-El-Barr

## 2017-01-29 NOTE — Progress Notes (Signed)
Admission:  Patient alert and oriented. Complaining of some discomfort and numbness in her upper extremities. Lung sounds clear, heart sounds normal. Surgical bandage on anterior neck clean dry and intact. RN gave pain medication and helped the patient reposition. Patient will get up for dinner to the recliner and RN will remove foley afterward. Patient oriented to room. Call bell in reach.  Harvie HeckMelanie Almir Botts, RN

## 2017-01-29 NOTE — Progress Notes (Signed)
Foley out [id number].  Madeline Beasley

## 2017-01-29 NOTE — Transfer of Care (Signed)
Immediate Anesthesia Transfer of Care Note  Patient: Madeline Beasley  Procedure(s) Performed: Procedure(s): ANTERIOR CERVICAL DECOMPRESSION/DISCECTOMY FUSION 2 LEVELS (N/A)  Patient Location: PACU  Anesthesia Type:General  Level of Consciousness: awake, alert  and oriented  Airway & Oxygen Therapy: Patient Spontanous Breathing and Patient connected to face mask oxygen  Post-op Assessment: Report given to RN and Post -op Vital signs reviewed and stable  Post vital signs: Reviewed and stable  Last Vitals:  Vitals:   01/29/17 0833  BP: (!) 156/69  Pulse: 81  Resp: 20  Temp: 36.6 C    Last Pain:  Vitals:   01/29/17 0833  TempSrc: Oral         Complications: No apparent anesthesia complications
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
## 2017-01-29 NOTE — Anesthesia Postprocedure Evaluation (Signed)
Anesthesia Post Note  Patient: Madeline FreshwaterJennifer Beasley  Procedure(s) Performed: Procedure(s) (LRB): ANTERIOR CERVICAL DECOMPRESSION/DISCECTOMY FUSION 2 LEVELS (N/A)  Patient location during evaluation: PACU Anesthesia Type: General Level of consciousness: awake and alert Pain management: pain level controlled Vital Signs Assessment: post-procedure vital signs reviewed and stable Respiratory status: spontaneous breathing, nonlabored ventilation, respiratory function stable and patient connected to nasal cannula oxygen Cardiovascular status: blood pressure returned to baseline and stable Postop Assessment: no signs of nausea or vomiting Anesthetic complications: no     Last Vitals:  Vitals:   01/29/17 1412 01/29/17 1437  BP: 139/73 138/76  Pulse: 79 75  Resp: 12 16  Temp: 36.8 C 36.9 C    Last Pain:  Vitals:   01/29/17 1545  TempSrc:   PainSc: 3                  Aleka Twitty S

## 2017-01-29 NOTE — Op Note (Signed)
Indications: Patient is a 36 y.o. With left arm numbness.  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  She tried conservative measures, but this persisted.  I spoke to patient about alternatives, including more conservative meausures vs. Surgery.  I explained risks of surgery, which include, but not limited to bleeding, infection, CSF leak, need for further surgery and neurological decline.  She wished to proceed with surgery.   Findings: cervical disc herniations at C5-6, c6-7  Preoperative Note:   Risks of surgery discussed include: infection, stroke, coma, death, paralysis, CSF leak, numbness, tingling, weakness, spinal cord/nerve root injury, hematoma formation, C5 palsy, esophageal injury, dysphagia, ventilator dependence, hoarseness of voice, recurrent laryngeal nerve injury, Horner's syndrome, CSF leak, decreased range of motion, vertebral/carotid artery injury, jugular vein injury, neck pain, persistent symptoms, need for further surgery, adjacent segment disease, failed hardware, pseudoarthrosis, malpositioned hardware, tracheal injury, seroma, and risks of anesthesia. They understood these risks and have agreed to proceed.  Operative Note:   The patient was brought from the preoperative center with intravenous access established as well as an arterial line in place.  The patient was then transferred to the operative table and underwent general anesthesia.  Foley catheter was placed.  The neck was kept in a neutral position for intubation. Once this was complete all pressure points were appropriately padded as well as a shoulder bump being placed beneath the patient in a gel headrest to facilitate cervical lordosis..  Once this was complete c-arm fluoroscopy was brought into the field and with the use of a metal object we were able to then localize on the surface of the skin for planned incision.  The shoulders were then taped to the table for further exposure.  No monitoring changes were  noted once, positioning was complete.  A planned horizontal neck incision was established with a marking pen.  Sterile prep and drape were then applied and a timeout was then observed.  Perioperative antibiotics were administered.  Once timeout was complete and #10 blade knife was then used to incise to the subcutaneous tissue and hemostasis was obtained with a bipolar.  The subcutaneous tissue layer was then mobilized with Metzenbaum dissection and a wheat Lander retractor was advanced.  The platysma layer was identified elevated coagulated and divided with the use of both bipolar cauterization and Metzenbaum scissors.  The subplatysmal layer was then mobilized in similar fashion to facilitate exposure.  The sternocleidomastoid muscle was visualized laterally as well as the medial strap muscles.  The carotid artery was palpated with blunt finger dissection.  With the use of a hand-held Cloward and Kitner dissection as well as blunt finger dissection the prevertebral fascia was identified and the esophagus was mobilized medially.  The longus coli muscles were visualized bilaterally.  C-arm fluoroscopy was then brought back into the field under sterile conditions we localized to the C6-7 level disc space and this was also delineated with a marking pen the wound was then irrigated.  The retractor system was then brought into the field and the esophagus was retracted medially and vasculature retracted laterally.  I dissected out the C5-6 disc space as well.  Bovie cauterization was then used to mobilize the longus coli muscles bilaterally to expose the uncovertebral joints as well as the corresponding vertebral bodies.  Distraction posts were then mounted into the rostral and caudal vertebral bodies.  Once distraction was applied and no changes were noted and neuro monitoring attention was then turned to the discectomy portion of the procedure.A #  15 blade knife was then used to incise through the  C5-6 annulus and a  pituitary rongeur was used to remove disc space contents.  The microscope was brought to the field under sterile conditions.  A 3-0 straight and up angled curette was used to remove remove further disc space contents extending to the uncovertebral joint as well as removing the cartilaginous endplates of the rostral and caudal vertebral bodies.  #2 and  #3 biting Kerrison was then used to remove the anterior osteophytes of the vertebral bodies as well as with the use of an orbital rongeur.  Once this was complete and with the use of a #3 matchstick drill bit the posterior osteophytes were removed in conjunction with a #1 and #2 biting Kerrison once the posterior longitudinal ligament was visualized this was elevated with a blunt right angle micro-nerve hook as well as with a 6 open angle curette and resected with a #1 and #2 biting Kerrisons extending to the medial uncovertebral joint.  Hemostasis was obtained with Surgifoam and a half by half patty further foraminal decompression was completed with a #1 biting Kerrison punch until a blunt right angle ball tip feeler could be passed to the frame without any residual compression this was completed bilaterally as well as rostral and caudal to the vertebral bodies in conjunction with removal of the posterior osteophyte.  Once this was done, attention was then turned to the arthrodesis portion of the procedure. A 5 mmtrial was then advanced having a strong fit a 5mm size interbody graft was then filled with components of allograft and malleted in position.  No changes in neuro monitoring were noted once this was complete the distraction posts were removed and hemostasis were obtained with Surgifoam.       The distraction pins were moved to C6 and C7.  A #15 blade knife was then used to incise through the  C6-7 annulus and a pituitary rongeur was used to remove disc space contents.  The microscope was brought to the field under sterile conditions.  A 3-0 straight and  up angled curette was used to remove remove further disc space contents extending to the uncovertebral joint as well as removing the cartilaginous endplates of the rostral and caudal vertebral bodies.  #2 and  #3 biting Kerrison was then used to remove the anterior osteophytes of the vertebral bodies as well as with the use of an orbital rongeur.  Once this was complete and with the use of a #3 matchstick drill bit the posterior osteophytes were removed in conjunction with a #1 and #2 biting Kerrison once the posterior longitudinal ligament was visualized this was elevated with a blunt right angle micro-nerve hook as well as with a 6 open angle curette and resected with a #1 and #2 biting Kerrisons extending to the medial uncovertebral joint.  Hemostasis was obtained with Surgifoam and a half by half patty further foraminal decompression was completed with a #1 biting Kerrison punch until a blunt right angle ball tip feeler could be passed to the frame without any residual compression this was completed bilaterally as well as rostral and caudal to the vertebral bodies in conjunction with removal of the posterior osteophyte.  Once this was done, attention was then turned to the arthrodesis portion of the procedure. A 8 mmtrial was then advanced having a strong fit a 8mm size interbody graft was then filled with components of allograft and malleted in position.  No changes in neuro monitoring were noted  once this was complete the distraction posts were removed and hemostasis were obtained with Surgifoam.     Once this was complete the wound was copiously irrigated and attention was then turned to closure the longus coli muscle was inspected and coagulated for hemostasis.The platysmal layer was reapproximated using 3-0 Vicryl suture in a running fashion subcutaneous tissue layers reapproximated using 3-0 Vicryl suture in a simple inverted fashion and the skin was closed closed with steristrips for reapproximation.   All counts were correct at the end of this case and no neuro monitoring changes were noted.  The patient was then awakened from anesthesia and taken to recovery.   I was present for all the critical portions of the case.

## 2017-01-30 DIAGNOSIS — M50222 Other cervical disc displacement at C5-C6 level: Secondary | ICD-10-CM | POA: Diagnosis not present

## 2017-01-30 MED ORDER — MENTHOL 3 MG MT LOZG: 1.0000 | LOZENGE | OROMUCOSAL | 12 refills | Status: AC | PRN

## 2017-01-30 MED ORDER — OXYCODONE-ACETAMINOPHEN 5-325 MG PO TABS: 1.0000 | ORAL_TABLET | ORAL | 0 refills | Status: AC | PRN

## 2017-01-30 NOTE — Discharge Instructions (Signed)
Your surgeon has performed an operation on your cervical spine (neck) to relieve pressure on the spinal cord and/or nerves. This involved making an incision in the front of your neck and removing one or more of the discs that support your spine. Next, a small piece of bone, a titanium plate, and screws were used to fuse two or more of the vertebrae (bones) together.  The following are instructions to help in your recovery once you have been discharged from the hospital. Even if you feel well, it is important that you follow these activity guidelines. If you do not let your neck heal properly from the surgery, you can increase the chance of return of your symptoms and other complications.  * Do not take anti-inflammatory medications for 3 months after surgery (naproxen [Aleve], ibuprofen [Advil, Motrin], celecoxib [Celebrex], etc.). These medications can prevent your bones from healing properly.  Activity    No bending, lifting, or twisting (BLT). Avoid lifting objects heavier than 10 pounds (gallon milk jug).  Where possible, avoid household activities that involve lifting, bending, reaching, pushing, or pulling such as laundry, vacuuming, grocery shopping, and childcare. Try to arrange for help from friends and family for these activities while your back heals.  Increase physical activity slowly as tolerated.  Taking short walks is encouraged, but avoid strenuous exercise. Do not jog, run, bicycle, lift weights, or participate in any other exercises unless specifically allowed by your doctor.  Talk to your doctor before resuming sexual activity.  You should not drive until cleared by your doctor.  Until released by your doctor, you should not return to work or school.  You should rest at home and let your body heal.   You may shower three days after your surgery.  After showering, lightly dab your incision dry. Do not take a tub bath or go swimming until approved by your doctor at your follow-up  appointment.  If your doctor ordered a cervical collar (neck brace) for you, you should wear it whenever you are out of bed. You may remove it when lying down or sleeping, but you should wear it at all other times. Not all neck surgeries require a cervical collar.  If you smoke, we strongly recommend that you quit.  Smoking has been proven to interfere with normal bone healing and will dramatically reduce the success rate of your surgery. Please contact [facility] (800-QUIT-NOW) and use the resources at www.[facility].com for assistance in stopping smoking.  Surgical Incision   If you have a dressing on your incision, you may remove it two days after your surgery. Keep your incision area clean and dry.  If you have staples or stitches on your incision, you should have a follow up scheduled for removal. If you do not have staples or stitches, you will have steri-strips (small pieces of surgical tape) or Dermabond glue. The steri-strips/glue should begin to peel away within about a week (it is fine if the steri-strips fall off before then). If the strips are still in place one week after your surgery, you may gently remove them.  Diet           You may return to your usual diet. However, you may experience discomfort when swallowing in the first month after your surgery. This is normal. You may find that softer foods are more comfortable for you to swallow. Be sure to stay hydrated.  When to Contact us  You may experience pain in your neck and/or pain between your shoulder  blades. This is normal and should improve in the next few weeks with the help of pain medication, muscle relaxers, and rest. Some patients report that a warm compress on the back of the neck or between the shoulder blades helps.  However, should you experience any of the following, contact us immediately:  New numbness or weakness  Pain that is progressively getting worse, and is not relieved by your pain medication, muscle  relaxers, rest, and warm compresses  Bleeding, redness, swelling, pain, or drainage from surgical incision  Chills or flu-like symptoms  Fever greater than 101.0 F (38.3 C)  Inability to eat, drink fluids, or take medications  Problems with bowel or bladder functions  Difficulty breathing or shortness of breath  Warmth, tenderness, or swelling in your calf Contact Information  During office hours (Monday-Friday 9 am to 5 pm), please call your physician at [phone]  After hours and weekends, please call the [facility] Operator at [phone] and ask for the Neurosurgery Resident On Call   For a life-threatening emergency, call 911

## 2017-01-30 NOTE — Progress Notes (Signed)
Patient has been given discharge instructions. Patient has no signs or symptoms of distress. Patient will be going home with her mother.

## 2017-01-31 ENCOUNTER — Encounter: Payer: Self-pay | Admitting: Neurological Surgery

## 2017-01-31 NOTE — Discharge Summary (Signed)
Admit Date: 01/29/2017 Discharge Date: 01/30/2017 Admitting Physician: Provider Not In System  Discharge Physician: No att. providers found  Primary Care Provider: Pcp Not In System  Admission Diagnoses:  Cervical stenosis    Discharge Diagnoses:  same  Consult Orders: None     Surgeries Performed: Procedure(s): ANTERIOR CERVICAL DECOMPRESSION/DISCECTOMY FUSION 2 LEVELS   Brief History of Present Illness: 36 y.o. With left handed numbnesss and cervical stenosis  Hospital Course: Patient admitted 01/29/2017 for surgery.  Underwent uncomplicated C5-7 ACDF on 01/29/2017 and was ready for discharge on 01/30/2017  Discharge Exam:  5/5 in all muscle groups.  Incision: c/d/i  Discharge Disposition:  home  Allergies: No Known Allergies  Patient Instructions:  Discharge Medication List as of 01/30/2017  7:41 AM    START taking these medications   Details  menthol-cetylpyridinium (CEPACOL) 3 MG lozenge Take 1 lozenge (3 mg total) by mouth as needed for sore throat (sore throat)., Starting Thu 01/30/2017, Normal    oxyCODONE-acetaminophen (PERCOCET/ROXICET) 5-325 MG tablet Take 1-2 tablets by mouth every 4 (four) hours as needed for moderate pain., Starting Thu 01/30/2017, Print      CONTINUE these medications which have NOT CHANGED   Details  Multiple Vitamins-Minerals (AIRBORNE GUMMIES PO) Take 3 tablets by mouth daily as needed (immune health)., Historical Med    norethindrone-ethinyl estradiol (MICROGESTIN,JUNEL,LOESTRIN) 1-20 MG-MCG tablet Take 1 tablet by mouth daily. , Starting Sat 12/14/2016, Historical Med    Pseudoephedrine-APAP-DM (DAYQUIL PO) Take 1 tablet by mouth 2 (two) times daily as needed (cold symptoms)., Historical Med      STOP taking these medications     ibuprofen (ADVIL,MOTRIN) 200 MG tablet      methylPREDNISolone (MEDROL DOSEPAK) 4 MG TBPK tablet         Code Status:  Prior  Activity:  activity as tolerated  Diet: @DIETSUPPORDS @      Results  Pending at Discharge:  none  Follow-up Tests Recommended:  none  No future appointments.    Time Spent:  15 minutes  Karenann CaiMuhammad Abd-El-Barr, MD 01/31/2017

## 2018-02-08 IMAGING — CT CT CERVICAL SPINE W/O CM
3 of 4 series · 11 of 33 positions shown, 13 images · non-contrast
Comparison: MRI cervical spine 12/15/2016

CLINICAL DATA: Disc protrusion with upper extremity numbness

EXAM:
CT CERVICAL SPINE WITHOUT CONTRAST
TECHNIQUE: Multidetector CT imaging of the cervical spine was performed without
intravenous contrast. Multiplanar CT image reconstructions were also
generated.

[Series 6: sagittal bone · sagittal · 0.27mm/px · 5 of 55 slices shown, 6 images]
[im 19/55  bone]
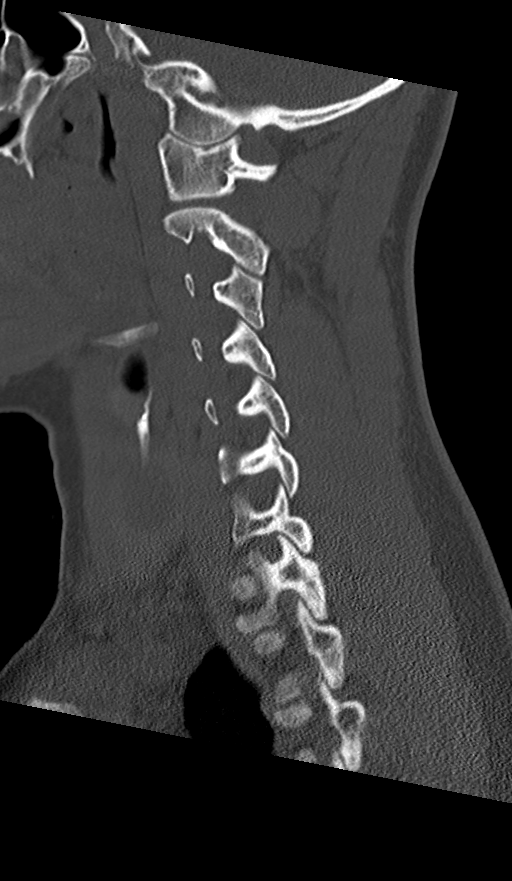
[im 23/55  bone]
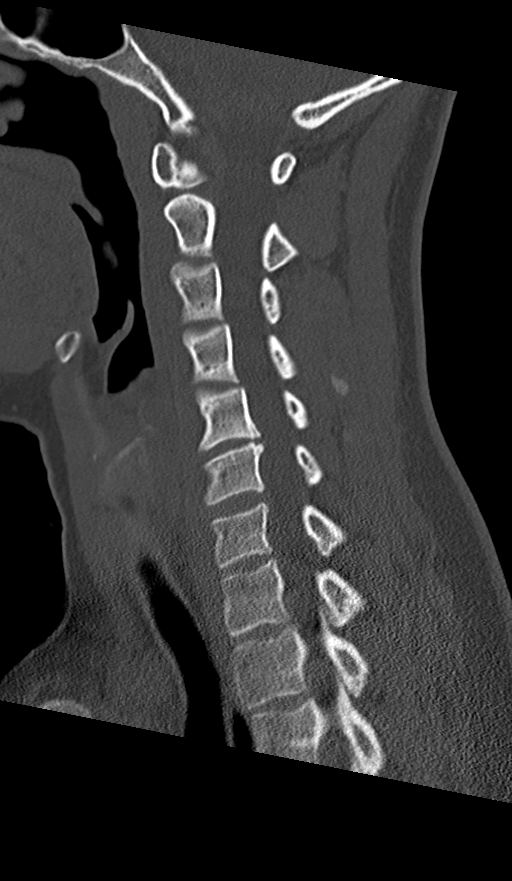
[im 28/55  soft-tissue]
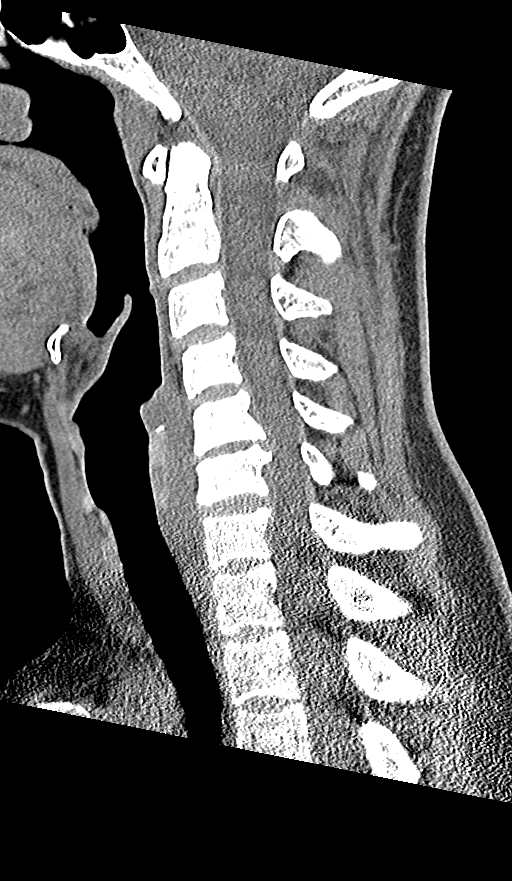
[im 28/55  bone]
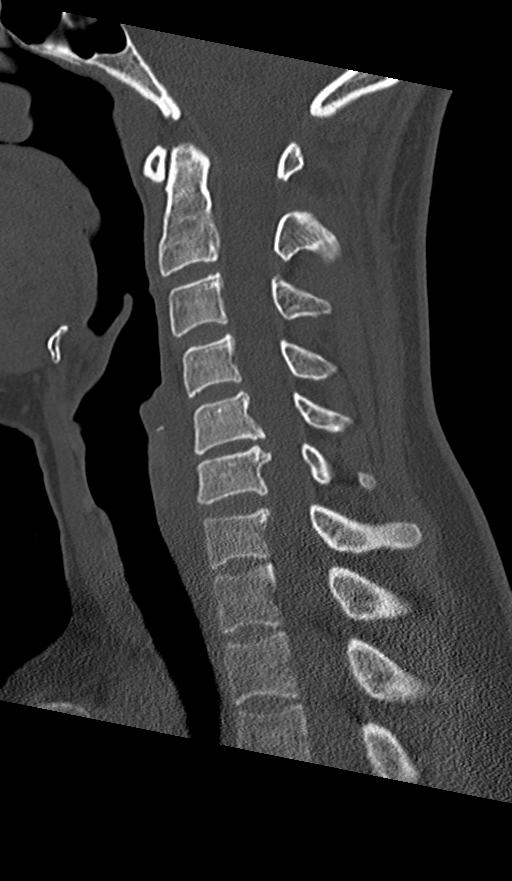
[im 32/55  bone]
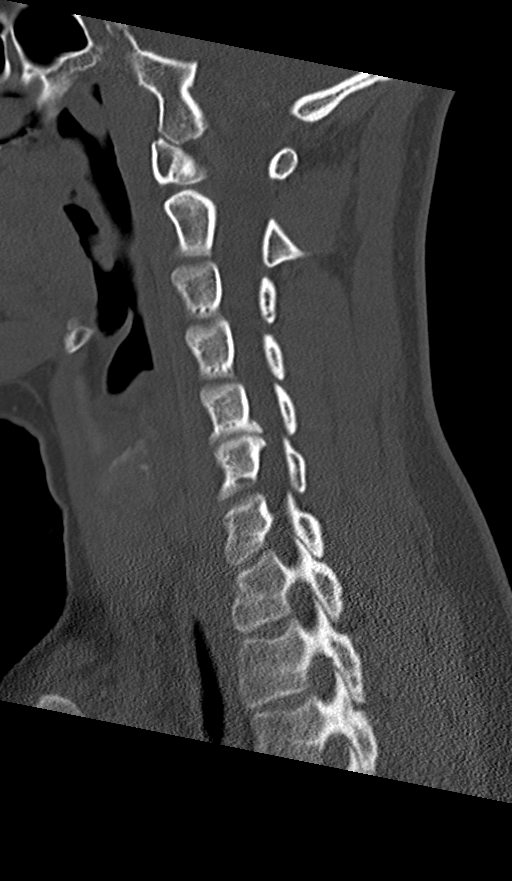
[im 37/55  bone]
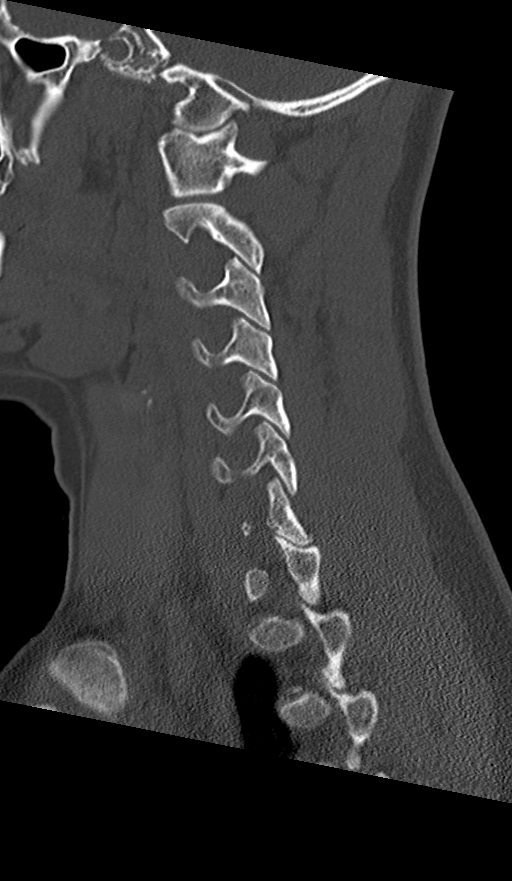

[Series 7: coronal bone · coronal · 0.27mm/px · 3 of 61 slices shown]
[im 13/61  bone]
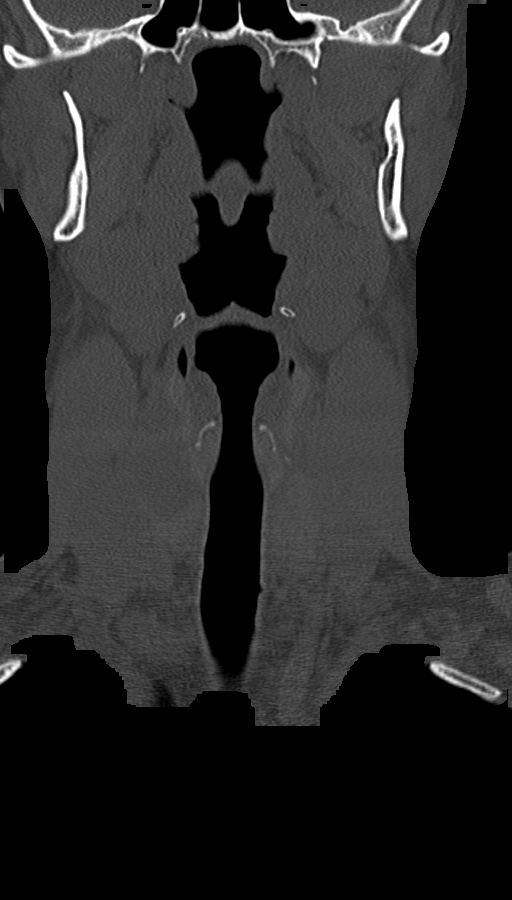
[im 25/61  bone]
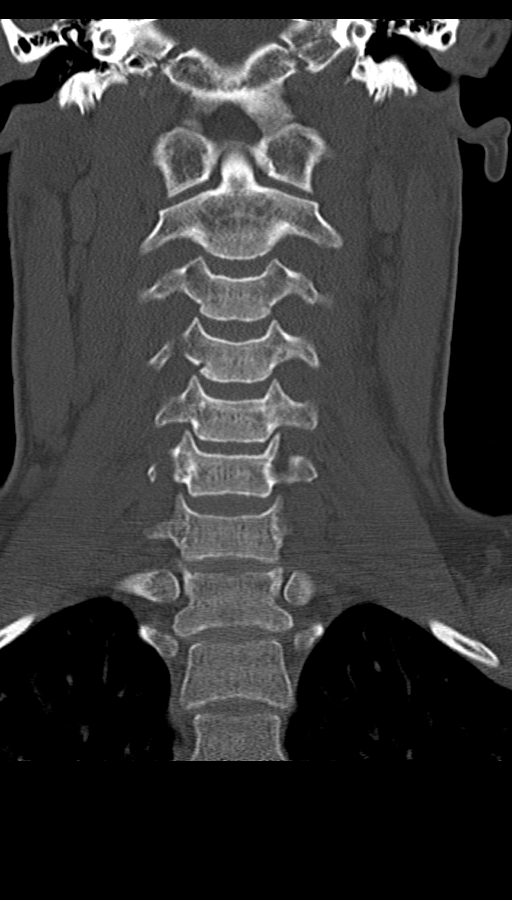
[im 37/61  bone]
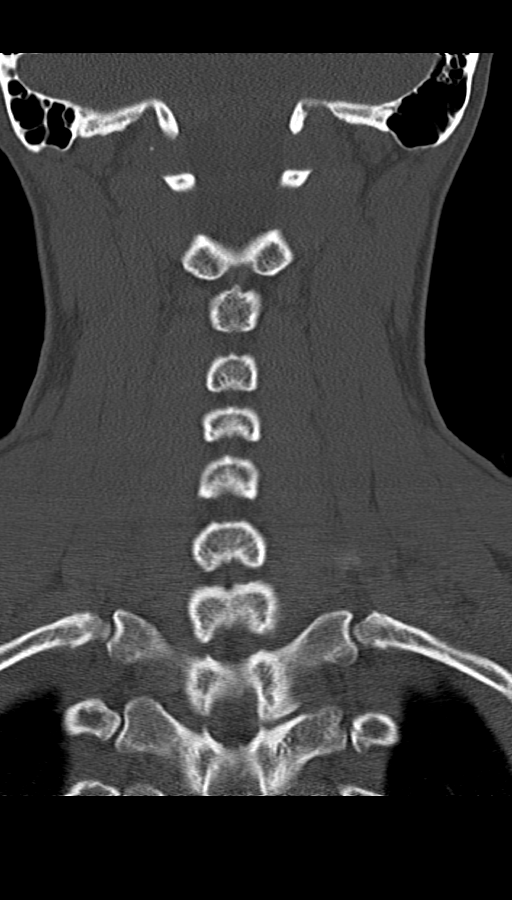

[Series 8: orthogonal bone · axial · 0.23mm/px · z∈[-236,-118]mm · 3 of 113 slices shown, 4 images]
[im 33/113  soft-tissue]
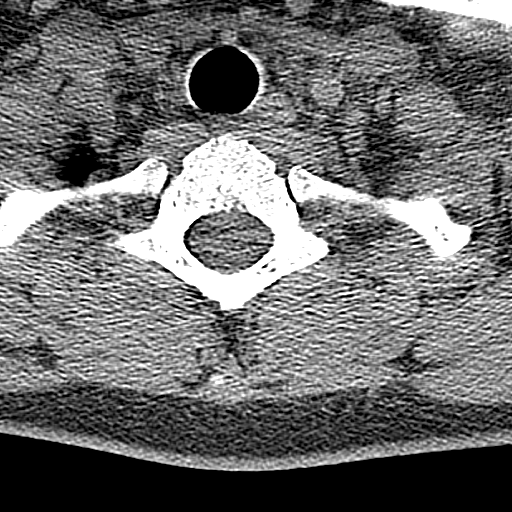
[im 33/113  bone]
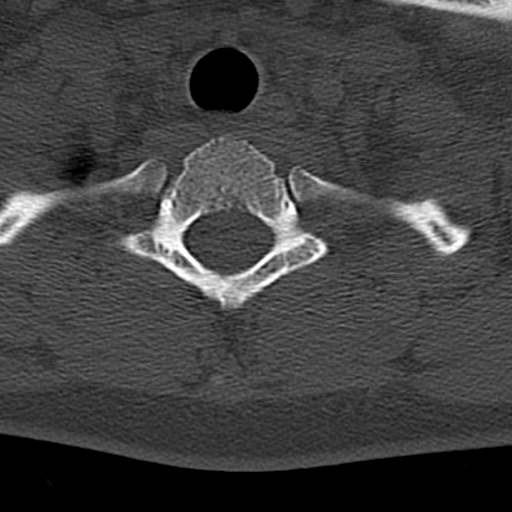
[im 65/113  bone]
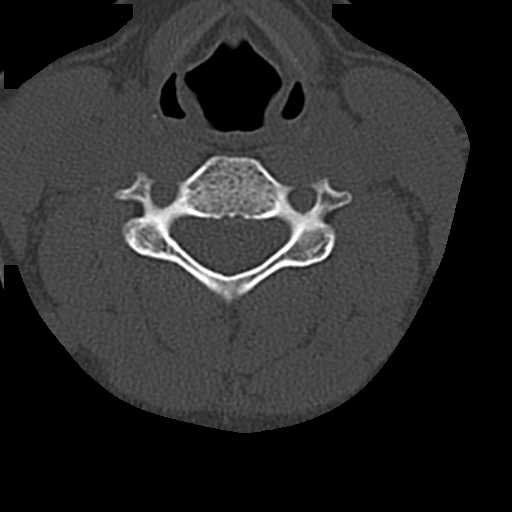
[im 97/113  bone]
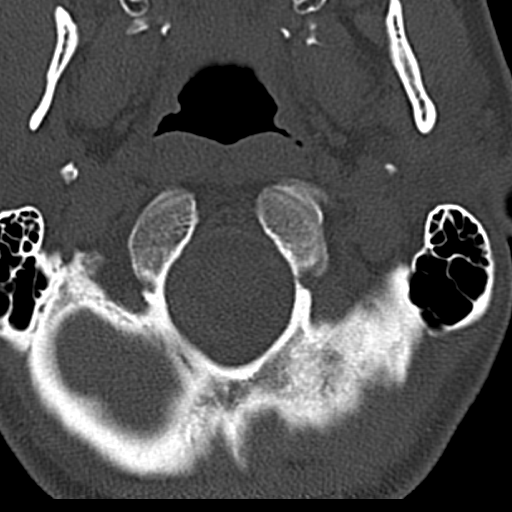

[11 of 33 positions shown; findings below may reference images not displayed]

FINDINGS: Alignment: Alignment is normal. There is straightening of the normal
cervical lordosis.

Skull base and vertebrae: Normal

Soft tissues and spinal canal: No prevertebral fluid or swelling. No
visible canal hematoma.

Disc levels: At C5-6, there is a disc osteophyte complex causes
moderate to severe spinal canal stenosis with impression on the
ventral cord. The soft disc component is best visualized on series
3, image 53 and series 6, image 27. Slightly more laterally, in the
left subarticular region, is the greatest bony component of the disc
osteophyte complex. Is there is mild left foraminal stenosis at
C5-6. No other significant spinal canal or neural foraminal
stenosis.

Upper chest: Clear

Other: There is a 1.6 cm hypodense right thyroid nodule.
IMPRESSION: 1. Disc osteophyte complex at C5-6 with moderate to severe spinal
canal stenosis and compression of the ventral aspect of spinal cord.
The soft disc component is most prominent centrally. The greatest
bony component is within the left subarticular space.
2. **An incidental finding of potential clinical significance has
been found. 1.6 cm hypodense right thyroid nodule. Further
characterization with dedicated thyroid ultrasound is recommended.**

## 2018-02-08 IMAGING — MR MR CERVICAL SPINE WO/W CM
19 of 22 series · 40 of 48 positions shown · IV contrast (multihance)
Comparison: None.

ADDENDUM:
Original report by Dr. Entremeses. Addendum by Dr. Guan Lin on 01/07/2017
at [DATE] a.m.: The central disc protrusion at C6-7 indents the
ventral spinal cord and narrows the thecal sac to 8 mm in AP
diameter, compatible with moderate spinal stenosis.
CLINICAL DATA: Left arm numbness

EXAM:
MRI CERVICAL SPINE WITHOUT AND WITH CONTRAST
TECHNIQUE: Multiplanar and multiecho pulse sequences of the cervical spine, to
include the craniocervical junction and cervicothoracic junction,
were obtained without and with intravenous contrast.
CONTRAST:  15mL MULTIHANCE GADOBENATE DIMEGLUMINE 529 MG/ML IV SOLN

[Series 2: T1 · sagittal · 5.0mm · 0.45mm/px · 1 of 23 slices shown (1 of 5)]
[im 1/23]
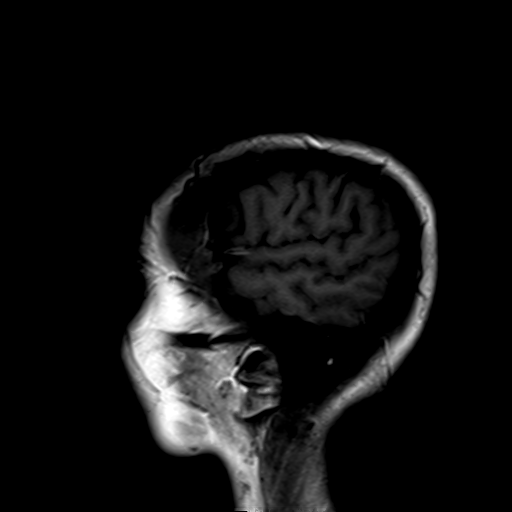

[Series 4: DWI · axial · 3.0mm · 1.80mm/px · z∈[-102,+50]mm · 4 of 51 slices shown (1 of 2)]
[im 1/51]
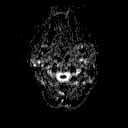
[im 17/51]
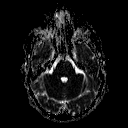
[im 34/51]
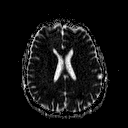
[im 51/51]
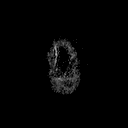

[Series 6: DWI · coronal · 3.0mm · 1.80mm/px · 3 of 45 slices shown (2 of 2)]
[im 1/45]
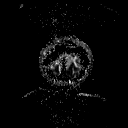
[im 23/45]
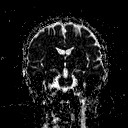
[im 45/45]
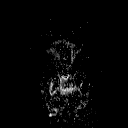

[Series 7: T2 · axial · 5.0mm · 0.45mm/px · z∈[-103,+52]mm · 2 of 25 slices shown (1 of 5)]
[im 1/25]
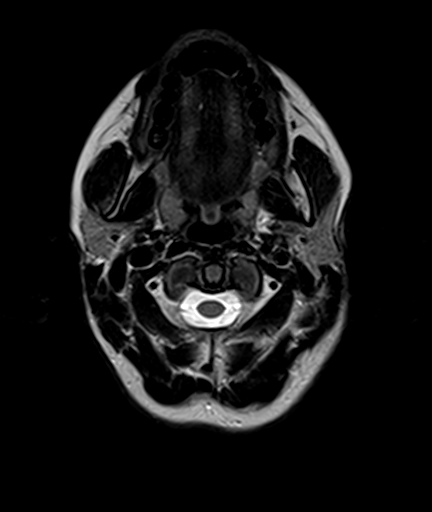
[im 25/25]
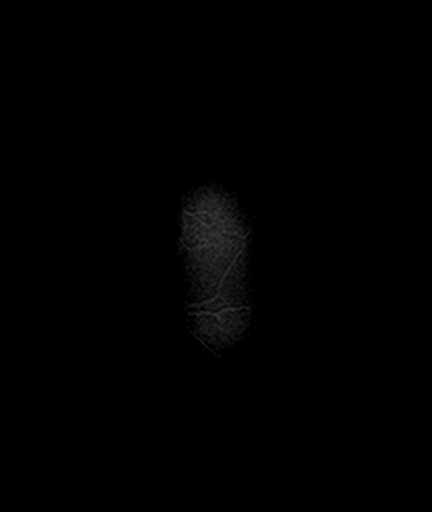

[Series 8: FLAIR · axial · 5.0mm · 0.45mm/px · z∈[-103,+53]mm · 2 of 25 slices shown (1 of 2)]
[im 1/25]
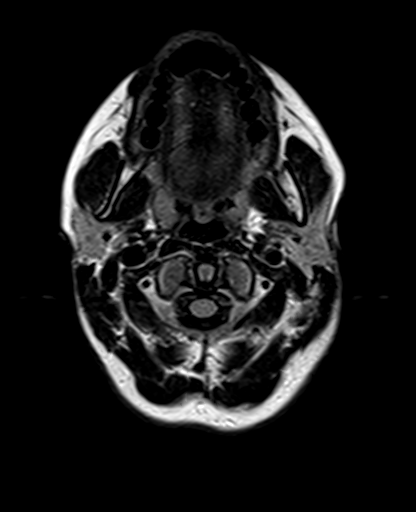
[im 25/25]
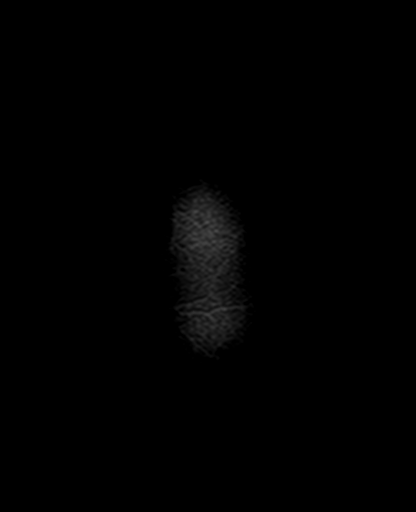

[Series 9: T2 · axial · 5.0mm · 1.20mm/px · z∈[-104,+52]mm · 2 of 25 slices shown (2 of 5)]
[im 1/25]
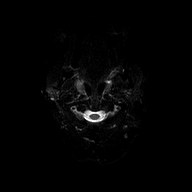
[im 25/25]
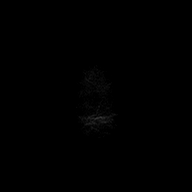

[Series 10: T1 · axial · 3.0mm · 1.00mm/px · z∈[-95,+46]mm · 4 of 48 slices shown (2 of 5)]
[im 1/48]
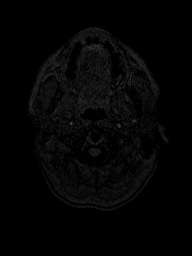
[im 16/48]
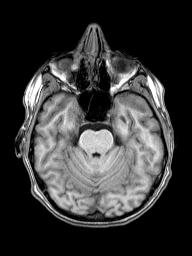
[im 32/48]
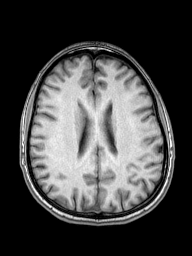
[im 48/48]
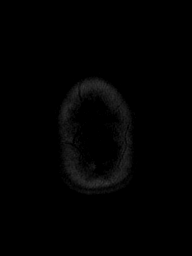

[Series 11: FLAIR · sagittal · 5.0mm · 0.43mm/px · 2 of 21 slices shown (2 of 2)]
[im 1/21]
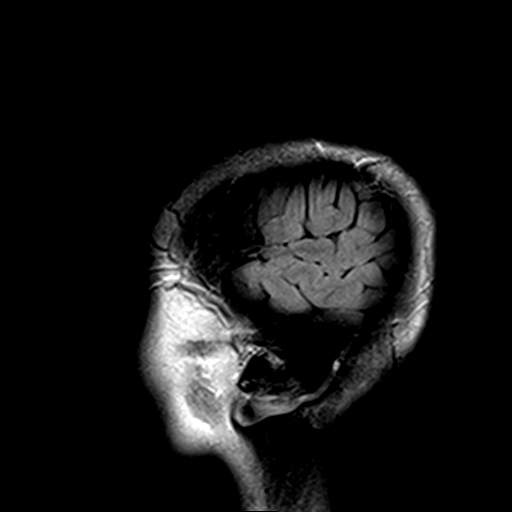
[im 21/21]
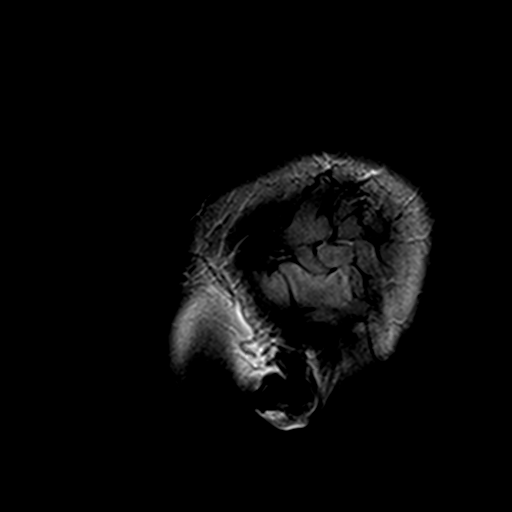

[Series 12: T2 · coronal · 5.0mm · 0.45mm/px · 2 of 25 slices shown (3 of 5)]
[im 1/25]
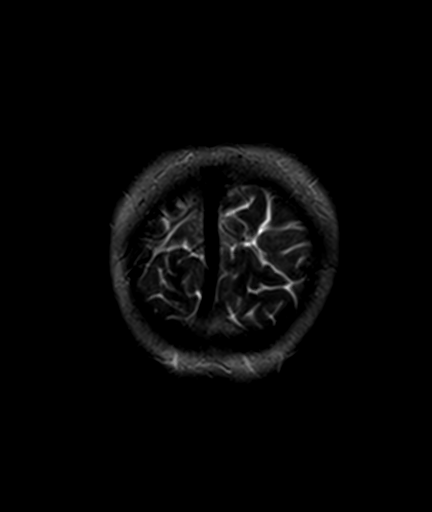
[im 25/25]
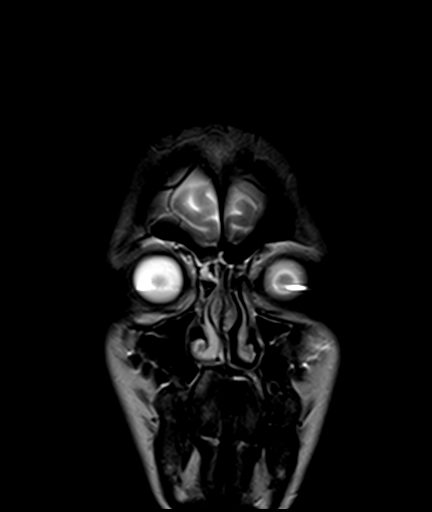

[Series 15: T2 · sagittal · 3.0mm · 0.70mm/px · 1 of 15 slices shown (4 of 5)]
[im 1/15]
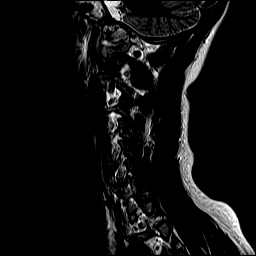

[Series 16: T1 · sagittal · 3.0mm · 0.70mm/px · 1 of 15 slices shown (3 of 5)]
[im 1/15]
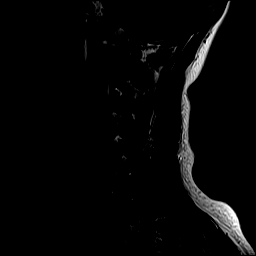

[Series 17: STIR · sagittal · 3.0mm · 0.70mm/px · 1 of 15 slices shown]
[im 1/15]
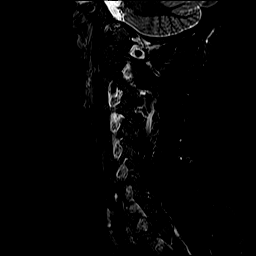

[Series 18: T2 · axial · 3.0mm · 0.70mm/px · z∈[-229,-133]mm · 2 of 26 slices shown (5 of 5)]
[im 1/26]
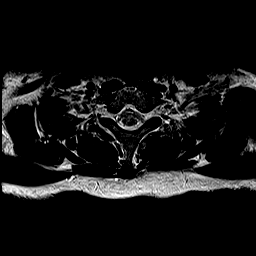
[im 26/26]
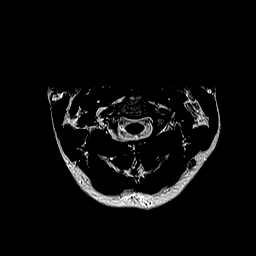

[Series 19: mpgr ax · axial · 3.0mm · 0.35mm/px · z∈[-229,-133]mm · 2 of 26 slices shown]
[im 1/26]
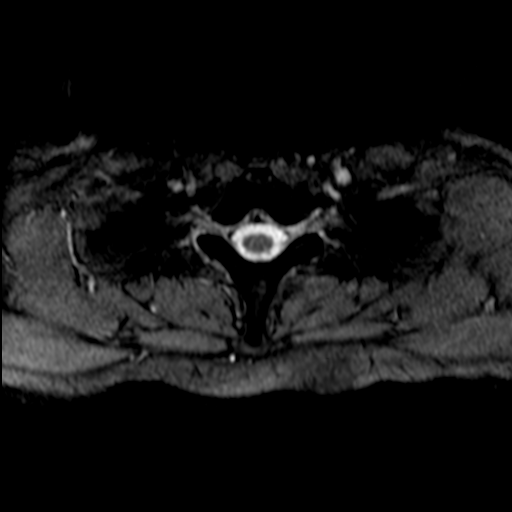
[im 26/26]
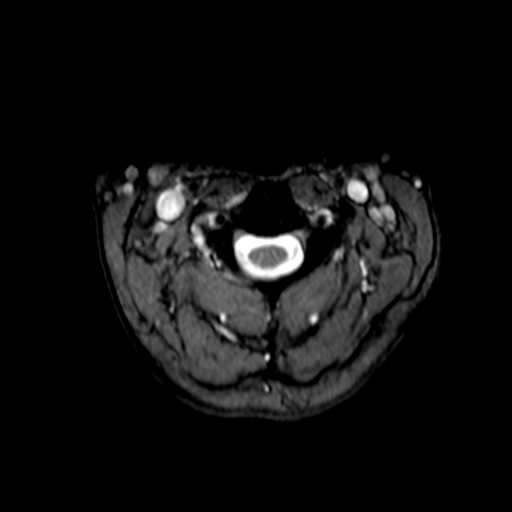

[Series 20: T1 · axial · 3.0mm · 0.70mm/px · z∈[-225,-133]mm · 2 of 25 slices shown (4 of 5)]
[im 1/25]
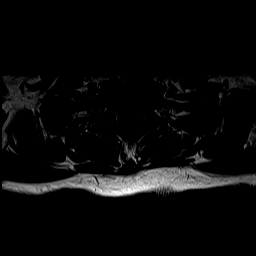
[im 25/25]
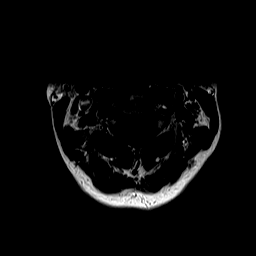

[Series 21: T1 fat-sat post-contrast · sagittal · 3.0mm · 0.70mm/px · 1 of 15 slices shown]
[im 1/15]
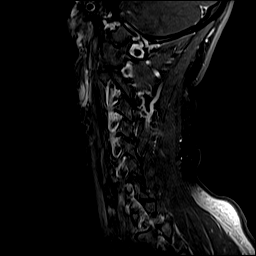

[Series 22: T1 post-contrast · axial · 3.0mm · 0.70mm/px · z∈[-225,-133]mm · 2 of 23 slices shown (1 of 2)]
[im 1/23]
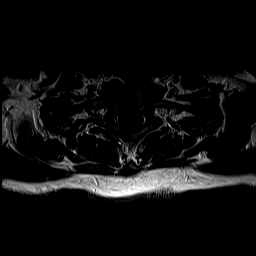
[im 23/23]
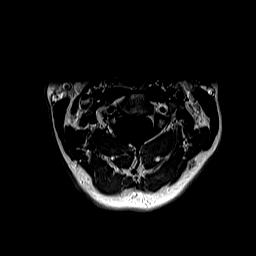

[Series 23: T1 · coronal · 5.5mm · 0.45mm/px · 2 of 27 slices shown (5 of 5)]
[im 1/27]
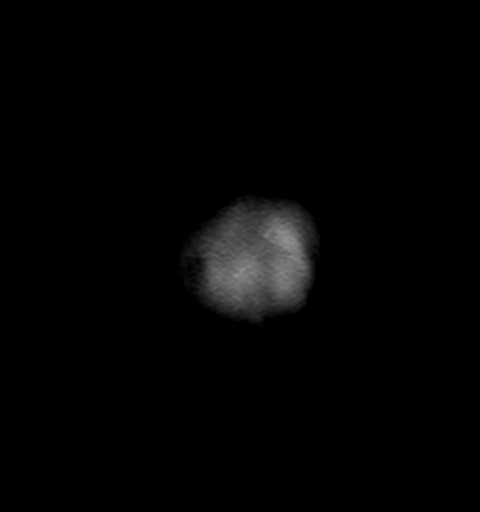
[im 27/27]
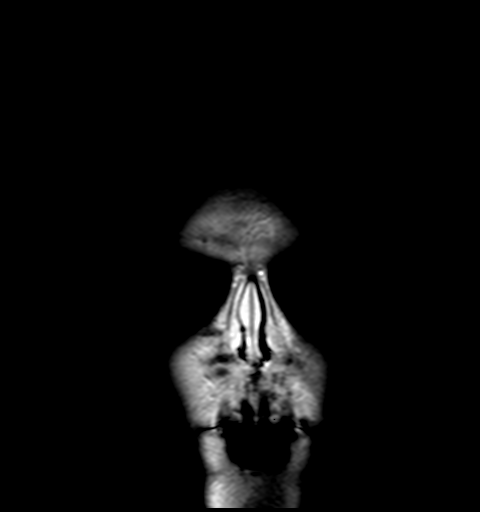

[Series 24: T1 post-contrast · axial · 3.0mm · 1.00mm/px · z∈[-95,+46]mm · 4 of 48 slices shown (2 of 2)]
[im 1/48]
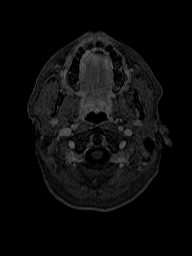
[im 16/48]
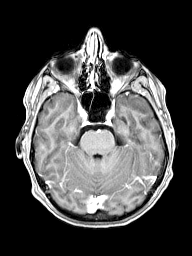
[im 32/48]
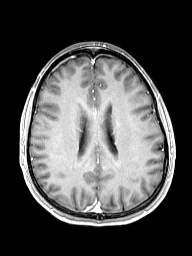
[im 48/48]
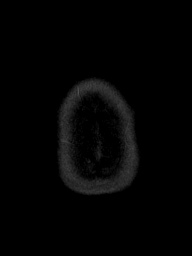

[40 of 48 positions shown; findings below may reference images not displayed]

FINDINGS: Alignment: Normal

Vertebrae: No acute compression fracture, facet edema or focal
marrow lesion.

Cord: There is mild hyperintense T2 weighted signal within the
spinal cord at C5-C6 level. No parenchymal contrast enhancement. No
cord atrophy.

Posterior Fossa, vertebral arteries, paraspinal tissues: Visualized
posterior fossa is normal. Vertebral artery flow voids are
preserved. Normal visualized paraspinal soft tissues.

Disc levels:

C1-C2: Normal.

C2-C3: Normal disc space and facets. No spinal canal or
neuroforaminal stenosis.

C3-C4: Normal disc space and facets. No spinal canal or
neuroforaminal stenosis.

C4-C5: Normal disc space and facets. No spinal canal or
neuroforaminal stenosis.

C5-C6: There is a large central disc protrusion that effaces the
ventral thecal sac and indents the anterior spinal cord. There is
mild hyperintense T2 weighted signal within the cord at this level.
There is bilateral, left-greater-than-right uncovertebral
hypertrophy, which results in moderate left foraminal stenosis.

C6-C7: Small central disc protrusion that narrows the ventral thecal
sac. Mild spinal canal stenosis. No neural foraminal stenosis.

C7-T1: Normal disc space and facets. No spinal canal or
neuroforaminal stenosis.
IMPRESSION: 1. Large central disc protrusion at C5-C6 with mass effect on the
spinal cord and associated hyperintense T2 weighted signal but
likely indicates myelopathy.
2. No evidence of demyelinating disease.
3. Moderate left C5-6 foraminal stenosis.
